# Patient Record
Sex: Female | Born: 1965 | Race: White | Hispanic: No | Marital: Married | Smoking: Never smoker
Health system: Southern US, Community
[De-identification: ages and names within clinical notes are randomized; demographics above are authoritative.]

## PROBLEM LIST (undated history)

## (undated) DIAGNOSIS — F32A Depression, unspecified: Secondary | ICD-10-CM

## (undated) DIAGNOSIS — R519 Headache, unspecified: Secondary | ICD-10-CM

## (undated) DIAGNOSIS — F419 Anxiety disorder, unspecified: Secondary | ICD-10-CM

## (undated) DIAGNOSIS — D649 Anemia, unspecified: Secondary | ICD-10-CM

## (undated) DIAGNOSIS — J302 Other seasonal allergic rhinitis: Secondary | ICD-10-CM

## (undated) DIAGNOSIS — F329 Major depressive disorder, single episode, unspecified: Secondary | ICD-10-CM

## (undated) DIAGNOSIS — R51 Headache: Secondary | ICD-10-CM

## (undated) DIAGNOSIS — R011 Cardiac murmur, unspecified: Secondary | ICD-10-CM

## (undated) HISTORY — PX: TUBAL LIGATION: SHX77

## (undated) HISTORY — PX: WISDOM TOOTH EXTRACTION: SHX21

## (undated) HISTORY — PX: OTHER SURGICAL HISTORY: SHX169

---

## 2008-06-18 ENCOUNTER — Emergency Department (HOSPITAL_COMMUNITY): Admission: EM | Admit: 2008-06-18 | Discharge: 2008-06-18 | Payer: Self-pay | Admitting: Emergency Medicine

## 2017-01-10 ENCOUNTER — Other Ambulatory Visit: Payer: Self-pay | Admitting: Obstetrics and Gynecology

## 2017-01-10 ENCOUNTER — Other Ambulatory Visit (HOSPITAL_COMMUNITY)
Admission: RE | Admit: 2017-01-10 | Discharge: 2017-01-10 | Disposition: A | Discharge status: Home / Self Care | Payer: 59 | Source: Ambulatory Visit | Attending: Obstetrics and Gynecology | Admitting: Obstetrics and Gynecology

## 2017-01-10 DIAGNOSIS — Z01419 Encounter for gynecological examination (general) (routine) without abnormal findings: Secondary | ICD-10-CM | POA: Insufficient documentation

## 2017-01-10 DIAGNOSIS — Z1151 Encounter for screening for human papillomavirus (HPV): Secondary | ICD-10-CM | POA: Insufficient documentation

## 2017-01-12 ENCOUNTER — Other Ambulatory Visit: Payer: Self-pay | Admitting: Obstetrics and Gynecology

## 2017-01-12 DIAGNOSIS — Z1231 Encounter for screening mammogram for malignant neoplasm of breast: Secondary | ICD-10-CM

## 2017-01-12 LAB — CYTOLOGY - PAP
Diagnosis: NEGATIVE
HPV: NOT DETECTED

## 2017-01-16 ENCOUNTER — Encounter: Payer: Self-pay | Admitting: Radiology

## 2017-01-16 ENCOUNTER — Ambulatory Visit
Admission: RE | Admit: 2017-01-16 | Discharge: 2017-01-16 | Disposition: A | Discharge status: Home / Self Care | Payer: 59 | Source: Ambulatory Visit | Attending: Obstetrics and Gynecology | Admitting: Obstetrics and Gynecology

## 2017-01-16 DIAGNOSIS — Z1231 Encounter for screening mammogram for malignant neoplasm of breast: Secondary | ICD-10-CM

## 2018-01-11 ENCOUNTER — Other Ambulatory Visit: Payer: Self-pay | Admitting: Obstetrics and Gynecology

## 2018-01-11 DIAGNOSIS — Z1231 Encounter for screening mammogram for malignant neoplasm of breast: Secondary | ICD-10-CM

## 2018-01-18 ENCOUNTER — Ambulatory Visit
Admission: RE | Admit: 2018-01-18 | Discharge: 2018-01-18 | Disposition: A | Discharge status: Home / Self Care | Payer: 59 | Source: Ambulatory Visit | Attending: Obstetrics and Gynecology | Admitting: Obstetrics and Gynecology

## 2018-01-18 DIAGNOSIS — Z1231 Encounter for screening mammogram for malignant neoplasm of breast: Secondary | ICD-10-CM

## 2018-01-30 ENCOUNTER — Other Ambulatory Visit: Payer: Self-pay | Admitting: Obstetrics and Gynecology

## 2018-02-27 ENCOUNTER — Encounter (HOSPITAL_COMMUNITY)
Admission: RE | Admit: 2018-02-27 | Discharge: 2018-02-27 | Disposition: A | Discharge status: Home / Self Care | Payer: 59 | Source: Ambulatory Visit | Attending: Obstetrics and Gynecology | Admitting: Obstetrics and Gynecology

## 2018-02-27 ENCOUNTER — Encounter (HOSPITAL_COMMUNITY): Payer: Self-pay

## 2018-02-27 ENCOUNTER — Other Ambulatory Visit: Payer: Self-pay

## 2018-02-27 DIAGNOSIS — Z01812 Encounter for preprocedural laboratory examination: Secondary | ICD-10-CM | POA: Diagnosis not present

## 2018-02-27 HISTORY — DX: Depression, unspecified: F32.A

## 2018-02-27 HISTORY — DX: Anemia, unspecified: D64.9

## 2018-02-27 HISTORY — DX: Other seasonal allergic rhinitis: J30.2

## 2018-02-27 HISTORY — DX: Cardiac murmur, unspecified: R01.1

## 2018-02-27 HISTORY — DX: Headache, unspecified: R51.9

## 2018-02-27 HISTORY — DX: Headache: R51

## 2018-02-27 HISTORY — DX: Major depressive disorder, single episode, unspecified: F32.9

## 2018-02-27 HISTORY — DX: Anxiety disorder, unspecified: F41.9

## 2018-02-27 LAB — CBC
HCT: 35.5 % — ABNORMAL LOW (ref 36.0–46.0)
Hemoglobin: 12 g/dL (ref 12.0–15.0)
MCH: 29.9 pg (ref 26.0–34.0)
MCHC: 33.8 g/dL (ref 30.0–36.0)
MCV: 88.5 fL (ref 78.0–100.0)
PLATELETS: 301 10*3/uL (ref 150–400)
RBC: 4.01 MIL/uL (ref 3.87–5.11)
RDW: 15.2 % (ref 11.5–15.5)
WBC: 7.6 10*3/uL (ref 4.0–10.5)

## 2018-02-27 NOTE — Patient Instructions (Addendum)
Your procedure is scheduled on: Wednesday Mar 13, 2018 at 2:30 pm  Enter through the Main Entrance of [facility] at: 1:00 pm  Pick up the phone at the desk and dial [phone].  Call this number if you have problems the morning of surgery: [phone].  Remember: Do NOT eat food: after Midnight on Tuesday May 21 Do NOT drink clear liquids after: 8 am Wednesday, day of surgery Take these medicines the morning of surgery with a SIP OF WATER:  None  STOP ALL VITAMINS AND SUPPLEMENTS, IBUPROFEN WEDNESDAY, 03/06/18.  Do NOT wear jewelry (body piercing), metal hair clips/bobby pins, make-up, or nail polish. Do NOT wear lotions, powders, or perfumes.  You may wear deoderant. Do NOT shave for 48 hours prior to surgery. Do NOT bring valuables to the hospital.  Have a responsible adult drive you home and stay with you for 24 hours after your procedure.  Home with Husband Clair Gulling cell [phone].

## 2018-03-12 NOTE — H&P (Signed)
  History of Present Illness  General:  Pt presents for preop for hysteroscopy, D&C, Myomectomy via Myosure and endometrial ablation. Pt is not currently bleeding. Lysteda has helped in the past.   Current Medications  Taking   Vitamin D 3 50,000 units pill 1 pill orally once a week   Magnesium 250 MG Tablet 1 tablet with a meal Orally Once a day   Ibuprofen 200 MG Tablet 1 tablet with food or milk as needed Orally Three times a day, Notes: prn   Integra 62.5-62.5-40-3 MG Capsule 1 capsule Orally Once a day   Calcium 500-125 MG-UNIT Tablet as directed Orally Once a day   Not-Taking   Lysteda(Tranexamic Acid) 650 MG Tablet 2 tablets Orally TID up to 5 days with menses   Medication List reviewed and reconciled with the patient    Past Medical History  Medical History Verified..           Surgical History  c-section 1988  c-section 1990  knee surgery 2006   Family History  Father: alive, pt does not have contact with father  Mother: alive, fibroids  denies any GYN family cancer hx.   Social History  General:  Tobacco use  cigarettes: Never smoked Tobacco history last updated 02/27/2018 Alcohol: Rare.  no Recreational drug use.  no Exercise.  Marital Status: married.  Children: Boys, 3.    Gyn History  Sexual activity currently sexually active.  Periods : every month, heavy bleeding, pain.  LMP 01/28/2018.  Birth control BTL.  Last pap smear date 01/10/2017 Neg/HPVneg.  Last mammogram date 01/18/2018.  Denies H/O STD.    OB History  Number of pregnancies 3.  Pregnancy # 1 live birth, C-section delivery.  Pregnancy # 2 live birth, C-section.  Pregnancy # 3 live birth, vaginal delivery.    Allergies  Codeine (for allergy): hallucinations   Hospitalization/Major Diagnostic Procedure  [place] measles 1990  None 12/2017   Review of Systems  Denies fever/chills, chest pain, SOB, headaches, numbness/tingling. No h/o complication with anesthesia, bleeding  disorders or blood clots.   Vital Signs  Wt 181, Wt change -2.3 lb, Ht 65.5, BMI 29.66, Pulse sitting 80, BP sitting 94/62.   Physical Examination  GENERAL:  Patient appears alert and oriented.  General Appearance: well-appearing, well-developed, no acute distress.  Speech: clear.  LUNGS:  Auscultation: no wheezing/rhonchi/rales. CTA bilaterally.  HEART:  Heart sounds: normal. RRR. no murmur.  ABDOMEN:  General: soft nontender, nondistended, no masses.  FEMALE GENITOURINARY:  Pelvic retroverted uterus, no discharge.  EXTREMITIES:  General: No edema or calf tenderness.     Assessments   1. Pre-operative clearance - Z01.818 (Primary)   2. Menorrhagia with regular cycle - N92.0   3. Fibroids, submucosal - D25.0   Treatment  1. Pre-operative clearance  Notes: Pt counseled on R/B/A of procedures. All questions answered. Ablation to lighten menses or stop menses.    Visit Codes  99213 OV LEVEL 3.    Follow Up  2 Weeks post op

## 2018-03-13 ENCOUNTER — Ambulatory Visit (HOSPITAL_COMMUNITY): Payer: 59 | Admitting: Anesthesiology

## 2018-03-13 ENCOUNTER — Ambulatory Visit (HOSPITAL_COMMUNITY)
Admission: RE | Admit: 2018-03-13 | Discharge: 2018-03-13 | Disposition: A | Discharge status: Home / Self Care | Payer: 59 | Source: Ambulatory Visit | Attending: Obstetrics and Gynecology | Admitting: Obstetrics and Gynecology

## 2018-03-13 ENCOUNTER — Ambulatory Visit (HOSPITAL_COMMUNITY): Payer: 59

## 2018-03-13 ENCOUNTER — Other Ambulatory Visit: Payer: Self-pay

## 2018-03-13 ENCOUNTER — Encounter (HOSPITAL_COMMUNITY)
Admission: RE | Disposition: A | Discharge status: Home / Self Care | Payer: Self-pay | Source: Ambulatory Visit | Attending: Obstetrics and Gynecology

## 2018-03-13 ENCOUNTER — Encounter (HOSPITAL_COMMUNITY): Payer: Self-pay

## 2018-03-13 DIAGNOSIS — D259 Leiomyoma of uterus, unspecified: Secondary | ICD-10-CM | POA: Insufficient documentation

## 2018-03-13 DIAGNOSIS — Z885 Allergy status to narcotic agent status: Secondary | ICD-10-CM | POA: Diagnosis not present

## 2018-03-13 DIAGNOSIS — N920 Excessive and frequent menstruation with regular cycle: Secondary | ICD-10-CM | POA: Insufficient documentation

## 2018-03-13 DIAGNOSIS — Z419 Encounter for procedure for purposes other than remedying health state, unspecified: Secondary | ICD-10-CM

## 2018-03-13 DIAGNOSIS — D649 Anemia, unspecified: Secondary | ICD-10-CM | POA: Insufficient documentation

## 2018-03-13 HISTORY — PX: HYSTEROSCOPY: SHX211

## 2018-03-13 HISTORY — PX: DILATATION & CURETTAGE/HYSTEROSCOPY WITH MYOSURE: SHX6511

## 2018-03-13 LAB — PREGNANCY, URINE: Preg Test, Ur: NEGATIVE

## 2018-03-13 SURGERY — DILATATION & CURETTAGE/HYSTEROSCOPY WITH MYOSURE
Anesthesia: General

## 2018-03-13 MED ORDER — ONDANSETRON HCL 4 MG/2ML IJ SOLN
INTRAMUSCULAR | Status: DC | PRN
  Administered 2018-03-13: 4 mg via INTRAVENOUS

## 2018-03-13 MED ORDER — SCOPOLAMINE 1 MG/3DAYS TD PT72
1.0000 | MEDICATED_PATCH | Freq: Once | TRANSDERMAL | Status: DC
  Administered 2018-03-13: 1.5 mg via TRANSDERMAL

## 2018-03-13 MED ORDER — PROPOFOL 10 MG/ML IV BOLUS
INTRAVENOUS | Status: DC | PRN
  Administered 2018-03-13: 180 mg via INTRAVENOUS
  Administered 2018-03-13: 20 mg via INTRAVENOUS

## 2018-03-13 MED ORDER — KETOROLAC TROMETHAMINE 30 MG/ML IJ SOLN
INTRAMUSCULAR | Status: DC | PRN
  Administered 2018-03-13: 30 mg via INTRAVENOUS

## 2018-03-13 MED ORDER — DEXAMETHASONE SODIUM PHOSPHATE 10 MG/ML IJ SOLN
INTRAMUSCULAR | Status: DC | PRN
  Administered 2018-03-13: 10 mg via INTRAVENOUS

## 2018-03-13 MED ORDER — HYDROMORPHONE HCL 1 MG/ML IJ SOLN
0.2500 mg | INTRAMUSCULAR | Status: DC | PRN
  Administered 2018-03-13: 0.25 mg via INTRAVENOUS
  Administered 2018-03-13: 0.5 mg via INTRAVENOUS

## 2018-03-13 MED ORDER — HYDROMORPHONE HCL 1 MG/ML IJ SOLN
INTRAMUSCULAR | Status: AC
  Filled 2018-03-13: qty 0.5

## 2018-03-13 MED ORDER — PROPOFOL 10 MG/ML IV BOLUS
INTRAVENOUS | Status: AC
  Filled 2018-03-13: qty 20

## 2018-03-13 MED ORDER — LIDOCAINE HCL 2 % IJ SOLN
INTRAMUSCULAR | Status: AC
  Filled 2018-03-13: qty 20

## 2018-03-13 MED ORDER — MIDAZOLAM HCL 2 MG/2ML IJ SOLN
INTRAMUSCULAR | Status: AC
  Filled 2018-03-13: qty 2

## 2018-03-13 MED ORDER — KETOROLAC TROMETHAMINE 30 MG/ML IJ SOLN
INTRAMUSCULAR | Status: AC
  Filled 2018-03-13: qty 1

## 2018-03-13 MED ORDER — HYDROMORPHONE HCL 1 MG/ML IJ SOLN
INTRAMUSCULAR | Status: AC
  Administered 2018-03-13: 0.5 mg via INTRAVENOUS
  Filled 2018-03-13: qty 0.5

## 2018-03-13 MED ORDER — SCOPOLAMINE 1 MG/3DAYS TD PT72
MEDICATED_PATCH | TRANSDERMAL | Status: AC
  Administered 2018-03-13: 1.5 mg via TRANSDERMAL
  Filled 2018-03-13: qty 1

## 2018-03-13 MED ORDER — SODIUM CHLORIDE 0.9 % IV SOLN
INTRAVENOUS | Status: DC | PRN
  Administered 2018-03-13: 19 mL via INTRAMUSCULAR

## 2018-03-13 MED ORDER — LIDOCAINE HCL (CARDIAC) PF 100 MG/5ML IV SOSY
PREFILLED_SYRINGE | INTRAVENOUS | Status: DC | PRN
  Administered 2018-03-13: 50 mg via INTRAVENOUS

## 2018-03-13 MED ORDER — ONDANSETRON HCL 4 MG/2ML IJ SOLN
INTRAMUSCULAR | Status: AC
  Filled 2018-03-13: qty 2

## 2018-03-13 MED ORDER — DEXAMETHASONE SODIUM PHOSPHATE 10 MG/ML IJ SOLN
INTRAMUSCULAR | Status: AC
  Filled 2018-03-13: qty 1

## 2018-03-13 MED ORDER — PROMETHAZINE HCL 25 MG/ML IJ SOLN: 6.2500 mg | INTRAMUSCULAR | Status: DC | PRN

## 2018-03-13 MED ORDER — LIDOCAINE HCL 2 % IJ SOLN
INTRAMUSCULAR | Status: DC | PRN
  Administered 2018-03-13: 10 mL

## 2018-03-13 MED ORDER — FENTANYL CITRATE (PF) 100 MCG/2ML IJ SOLN
INTRAMUSCULAR | Status: AC
  Filled 2018-03-13: qty 2

## 2018-03-13 MED ORDER — VASOPRESSIN 20 UNIT/ML IV SOLN
INTRAVENOUS | Status: AC
  Filled 2018-03-13: qty 1

## 2018-03-13 MED ORDER — SODIUM CHLORIDE 0.9 % IR SOLN
Status: DC | PRN
  Administered 2018-03-13: 6000 mL

## 2018-03-13 MED ORDER — SODIUM CHLORIDE 0.9 % IJ SOLN
INTRAMUSCULAR | Status: AC
Start: 2018-03-13 — End: ?
  Filled 2018-03-13: qty 50

## 2018-03-13 MED ORDER — SODIUM CHLORIDE 0.9 % IJ SOLN
INTRAMUSCULAR | Status: DC | PRN
  Administered 2018-03-13: 50 mL via INTRAVENOUS

## 2018-03-13 MED ORDER — MIDAZOLAM HCL 2 MG/2ML IJ SOLN
INTRAMUSCULAR | Status: DC | PRN
  Administered 2018-03-13: 2 mg via INTRAVENOUS

## 2018-03-13 MED ORDER — LACTATED RINGERS IV SOLN
INTRAVENOUS | Status: DC
  Administered 2018-03-13: 125 mL/h via INTRAVENOUS

## 2018-03-13 MED ORDER — FENTANYL CITRATE (PF) 100 MCG/2ML IJ SOLN
INTRAMUSCULAR | Status: DC | PRN
  Administered 2018-03-13: 50 ug via INTRAVENOUS
  Administered 2018-03-13: 25 ug via INTRAVENOUS
  Administered 2018-03-13: 100 ug via INTRAVENOUS
  Administered 2018-03-13: 25 ug via INTRAVENOUS

## 2018-03-13 MED ORDER — GLYCOPYRROLATE 0.2 MG/ML IJ SOLN
INTRAMUSCULAR | Status: AC
  Filled 2018-03-13: qty 1

## 2018-03-13 MED ORDER — LIDOCAINE HCL (CARDIAC) PF 100 MG/5ML IV SOSY
PREFILLED_SYRINGE | INTRAVENOUS | Status: AC
  Filled 2018-03-13: qty 5

## 2018-03-13 SURGICAL SUPPLY — 20 items
CANISTER SUCT 3000ML PPV (MISCELLANEOUS) ×3 IMPLANT
CATH ROBINSON RED A/P 16FR (CATHETERS) ×3 IMPLANT
DEVICE MYOSURE LITE (MISCELLANEOUS) IMPLANT
DEVICE MYOSURE REACH (MISCELLANEOUS) IMPLANT
DILATOR CANAL MILEX (MISCELLANEOUS) ×3 IMPLANT
FILTER ARTHROSCOPY CONVERTOR (FILTER) ×3 IMPLANT
GAUZE SPONGE 4X4 16PLY XRAY LF (GAUZE/BANDAGES/DRESSINGS) ×2 IMPLANT
GLOVE BIO SURGEON STRL SZ7 (GLOVE) ×3 IMPLANT
GLOVE BIOGEL PI IND STRL 7.0 (GLOVE) ×2 IMPLANT
GLOVE BIOGEL PI INDICATOR 7.0 (GLOVE) ×4
GOWN STRL REUS W/TWL LRG LVL3 (GOWN DISPOSABLE) ×6 IMPLANT
MYOSURE XL FIBROID REM (MISCELLANEOUS) ×3
PACK VAGINAL MINOR WOMEN LF (CUSTOM PROCEDURE TRAY) ×3 IMPLANT
PAD OB MATERNITY 4.3X12.25 (PERSONAL CARE ITEMS) ×6 IMPLANT
SEAL ROD LENS SCOPE MYOSURE (ABLATOR) ×3 IMPLANT
SET GENESYS HTA PROCERVA (MISCELLANEOUS) ×3 IMPLANT
SYSTEM TISS REMOVAL MYSR XL RM (MISCELLANEOUS) IMPLANT
TOWEL OR 17X24 6PK STRL BLUE (TOWEL DISPOSABLE) ×6 IMPLANT
TUBING AQUILEX INFLOW (TUBING) ×5 IMPLANT
TUBING AQUILEX OUTFLOW (TUBING) ×3 IMPLANT

## 2018-03-13 NOTE — Anesthesia Postprocedure Evaluation (Signed)
Anesthesia Post Note  Patient: Meredith Moreno  Procedure(s) Performed: DILATATION & CURETTAGE/HYSTEROSCOPY WITH MYOSURE ULTRASOUND ASSISTED (N/A ) HYSTEROSCOPY WITH HYDROTHERMAL ABLATION (N/A )     Patient location during evaluation: PACU Anesthesia Type: General Level of consciousness: awake and alert Pain management: pain level controlled Vital Signs Assessment: post-procedure vital signs reviewed and stable Respiratory status: spontaneous breathing, nonlabored ventilation, respiratory function stable and patient connected to nasal cannula oxygen Cardiovascular status: blood pressure returned to baseline and stable Postop Assessment: no apparent nausea or vomiting Anesthetic complications: no    Last Vitals:  Vitals:   03/13/18 1615 03/13/18 1630  BP: 113/79 118/79  Pulse: (!) 59 66  Resp: 12 15  Temp: 36.7 C   SpO2: 100% 100%    Last Pain:  Vitals:   03/13/18 1630  PainSc: 3    Pain Goal: Patients Stated Pain Goal: 3 (03/13/18 1523)               Tiajuana Amass

## 2018-03-13 NOTE — Anesthesia Procedure Notes (Signed)
Procedure Name: LMA Insertion Date/Time: 03/13/2018 1:26 PM Performed by: Asher Muir, CRNA Pre-anesthesia Checklist: Patient being monitored, Patient identified, Emergency Drugs available and Suction available Patient Re-evaluated:Patient Re-evaluated prior to induction Oxygen Delivery Method: Circle system utilized Preoxygenation: Pre-oxygenation with 100% oxygen Induction Type: IV induction and Inhalational induction Ventilation: Mask ventilation without difficulty LMA: LMA inserted LMA Size: 4.0 Number of attempts: 1 Dental Injury: Teeth and Oropharynx as per pre-operative assessment

## 2018-03-13 NOTE — Transfer of Care (Signed)
Immediate Anesthesia Transfer of Care Note  Patient: Meredith Moreno  Procedure(s) Performed: DILATATION & CURETTAGE/HYSTEROSCOPY WITH MYOSURE ULTRASOUND ASSISTED (N/A ) HYSTEROSCOPY WITH HYDROTHERMAL ABLATION (N/A )  Patient Location: PACU  Anesthesia Type:General  Level of Consciousness: sedated  Airway & Oxygen Therapy: Patient Spontanous Breathing and Patient connected to nasal cannula oxygen  Post-op Assessment: Report given to RN  Post vital signs: Reviewed and stable  Last Vitals:  Vitals Value Taken Time  BP    Temp    Pulse    Resp    SpO2      Last Pain:  Vitals:   03/13/18 1200  PainSc: 2       Patients Stated Pain Goal: 3 (70/14/10 3013)  Complications: No apparent anesthesia complications

## 2018-03-13 NOTE — Discharge Instructions (Addendum)
Dilation and Curettage or Vacuum Curettage, Care After °This sheet gives you information about how to care for yourself after your procedure. Your health care provider may also give you more specific instructions. If you have problems or questions, contact your health care provider. °What can I expect after the procedure? °After your procedure, it is common to have: °· Mild pain or cramping. °· Some vaginal bleeding or spotting. ° °These may last for up to 2 weeks after your procedure. °Follow these instructions at home: °Activity ° °· Do not drive or use heavy machinery while taking prescription pain medicine. °· Avoid driving for the first 24 hours after your procedure. °· Take frequent, short walks, followed by rest periods, throughout the day. Ask your health care provider what activities are safe for you. After 1-2 days, you may be able to return to your normal activities. °· Do not lift anything heavier than 10 lb (4.5 kg) until your health care provider approves. °· For at least 2 weeks, or as long as told by your health care provider, do not: °? Douche. °? Use tampons. °? Have sexual intercourse. °General instructions ° °· Take over-the-counter and prescription medicines only as told by your health care provider. This is especially important if you take blood thinning medicine. °· Do not take baths, swim, or use a hot tub until your health care provider approves. Take showers instead of baths. °· Wear compression stockings as told by your health care provider. These stockings help to prevent blood clots and reduce swelling in your legs. °· It is your responsibility to get the results of your procedure. Ask your health care provider, or the department performing the procedure, when your results will be ready. °· Keep all follow-up visits as told by your health care provider. This is important. °Contact a health care provider if: °· You have severe cramps that get worse or that do not get better with  medicine. °· You have severe abdominal pain. °· You cannot drink fluids without vomiting. °· You develop pain in a different area of your pelvis. °· You have bad-smelling vaginal discharge. °· You have a rash. °Get help right away if: °· You have vaginal bleeding that soaks more than one sanitary pad in 1 hour, for 2 hours in a row. °· You pass large blood clots from your vagina. °· You have a fever that is above 100.4°F (38.0°C). °· Your abdomen feels very tender or hard. °· You have chest pain. °· You have shortness of breath. °· You cough up blood. °· You feel dizzy or light-headed. °· You faint. °· You have pain in your neck or shoulder area. °This information is not intended to replace advice given to you by your health care provider. Make sure you discuss any questions you have with your health care provider. °Document Released: 10/06/2000 Document Revised: 06/07/2016 Document Reviewed: 05/11/2016 °Elsevier Interactive Patient Education © 2018 [employer] Inc[employer] ° ° °Endometrial Ablation °Endometrial ablation is a procedure that destroys the thin inner layer of the lining of the uterus (endometrium). This procedure may be done: °· To stop heavy periods. °· To stop bleeding that is causing anemia. °· To control irregular bleeding. °· To treat bleeding caused by small tumors (fibroids) in the endometrium. ° °This procedure is often an alternative to major surgery, such as removal of the uterus and cervix (hysterectomy). As a result of this procedure: °· You may not be able to have children. However, if you are premenopausal (you have   not gone through menopause): °? You may still have a small chance of getting pregnant. °? You will need to use a reliable method of birth control after the procedure to prevent pregnancy. °· You may stop having a menstrual period, or you may have only a small amount of bleeding during your period. Menstruation may return several years after the procedure. ° °Tell a health care provider  about: °· Any allergies you have. °· All medicines you are taking, including vitamins, herbs, eye drops, creams, and over-the-counter medicines. °· Any problems you or family members have had with the use of anesthetic medicines. °· Any blood disorders you have. °· Any surgeries you have had. °· Any medical conditions you have. °What are the risks? °Generally, this is a safe procedure. However, problems may occur, including: °· A hole (perforation) in the uterus or bowel. °· Infection of the uterus, bladder, or vagina. °· Bleeding. °· Damage to other structures or organs. °· An air bubble in the lung (air embolus). °· Problems with pregnancy after the procedure. °· Failure of the procedure. °· Decreased ability to diagnose cancer in the endometrium. ° °What happens before the procedure? °· You will have tests of your endometrium to make sure there are no pre-cancerous cells or cancer cells present. °· You may have an ultrasound of the uterus. °· You may be given medicines to thin the endometrium. °· Ask your health care provider about: °? Changing or stopping your regular medicines. This is especially important if you take diabetes medicines or blood thinners. °? Taking medicines such as aspirin and ibuprofen. These medicines can thin your blood. Do not take these medicines before your procedure if your doctor tells you not to. °· Plan to have someone take you home from the hospital or clinic. °What happens during the procedure? °· You will lie on an exam table with your feet and legs supported as in a pelvic exam. °· To lower your risk of infection: °? Your health care team will wash or sanitize their hands and put on germ-free (sterile) gloves. °? Your genital area will be washed with soap. °· An IV tube will be inserted into one of your veins. °· You will be given a medicine to help you relax (sedative). °· A surgical instrument with a light and camera (resectoscope) will be inserted into your vagina and moved  into your uterus. This allows your surgeon to see inside your uterus. °· Endometrial tissue will be removed using one of the following methods: °? Radiofrequency. This method uses a radiofrequency-alternating electric current to remove the endometrium. °? Cryotherapy. This method uses extreme cold to freeze the endometrium. °? Heated-free liquid. This method uses a heated saltwater (saline) solution to remove the endometrium. °? Microwave. This method uses high-energy microwaves to heat up the endometrium and remove it. °? Thermal balloon. This method involves inserting a catheter with a balloon tip into the uterus. The balloon tip is filled with heated fluid to remove the endometrium. °The procedure may vary among health care providers and hospitals. °What happens after the procedure? °· Your blood pressure, heart rate, breathing rate, and blood oxygen level will be monitored until the medicines you were given have worn off. °· As tissue healing occurs, you may notice vaginal bleeding for 4-6 weeks after the procedure. You may also experience: °? Cramps. °? Thin, watery vaginal discharge that is light pink or brown in color. °? A need to urinate more frequently than usual. °? Nausea. °· Do   not drive for 24 hours if you were given a sedative. °· Do not have sex or insert anything into your vagina until your health care provider approves. °Summary °· Endometrial ablation is done to treat the many causes of heavy menstrual bleeding. °· The procedure may be done only after medications have been tried to control the bleeding. °· Plan to have someone take you home from the hospital or clinic. °This information is not intended to replace advice given to you by your health care provider. Make sure you discuss any questions you have with your health care provider. °Document Released: 08/18/2004 Document Revised: 10/26/2016 Document Reviewed: 10/26/2016 °Elsevier Interactive Patient Education © 2017 Elsevier  Inc. ° ° ° ° °Post Anesthesia Home Care Instructions ° °Activity: °Get plenty of rest for the remainder of the day. A responsible individual must stay with you for 24 hours following the procedure.  °For the next 24 hours, DO NOT: °-Drive a car °-Operate machinery °-Drink alcoholic beverages °-Take any medication unless instructed by your physician °-Make any legal decisions or sign important papers. ° °Meals: °Start with liquid foods such as gelatin or soup. Progress to regular foods as tolerated. Avoid greasy, spicy, heavy foods. If nausea and/or vomiting occur, drink only clear liquids until the nausea and/or vomiting subsides. Call your physician if vomiting continues. ° °Special Instructions/Symptoms: °Your throat may feel dry or sore from the anesthesia or the breathing tube placed in your throat during surgery. If this causes discomfort, gargle with warm salt water. The discomfort should disappear within 24 hours. ° °If you had a scopolamine patch placed behind your ear for the management of post- operative nausea and/or vomiting: ° °1. The medication in the patch is effective for 72 hours, after which it should be removed.  Wrap patch in a tissue and discard in the trash. Wash hands thoroughly with soap and water. °2. You may remove the patch earlier than 72 hours if you experience unpleasant side effects which may include dry mouth, dizziness or visual disturbances. °3. Avoid touching the patch. Wash your hands with soap and water after contact with the patch. °  ° ° °

## 2018-03-13 NOTE — Op Note (Signed)
NAME: Meredith Moreno, Meredith Moreno MEDICAL RECORD FU:[id number] ACCOUNT [id number] DATE OF BIRTH:1966-10-15 FACILITY: [facility] LOCATION: [facility] PHYSICIAN:Sebastiana Wuest Al Decant, MD  OPERATIVE REPORT  DATE OF PROCEDURE:  03/13/2018  PREOPERATIVE DIAGNOSES:  Menorrhagia and fibroids.  POSTOPERATIVE DIAGNOSES:  Menorrhagia and fibroids.  PROCEDURES PERFORMED:  Hysteroscopy, dilatation and curettage with hysteroscopic myomectomy with MyoSure and endometrial ablation with HTA.  SURGEON:  Thurnell Lose, MD  ASSISTANT:  Technician.  ANESTHESIA:  Local paracervical block and general.  ESTIMATED BLOOD LOSS:  50.  DRAINS:  I and O, catheterization of the bladder.   LOCAL:   2% lidocaine and vasopressin 20 and 50.    SPECIMEN:  The source is fibroid and endometrial curettings.  DISPOSITION OF SPECIMENS:  To pathology.  PATIENT DISPOSITION:  To the PACU hemodynamically stable.  FINDINGS:  Large endometrial fibroid on the patient's right side in the lower uterine segment sharply retroverted uterus.  Normal appearing endometrium.  DESCRIPTION OF PROCEDURE:  The patient was identified in the holding area.  She was then taken to the operating room with IV running.  She was placed in the dorsal lithotomy position, underwent general endotracheal anesthesia and prepped and draped in a  normal sterile fashion.  A timeout was performed.  SCDs were on her legs and operating.  No antibiotics were given.  A Graves speculum was inserted into the vagina.  The cervix was visualized.  Anterior lip of the cervix was injected with 2% lidocaine and then a single tooth tenaculum was applied.  Paracervical block was performed for a total of 10 mL of 2% lidocaine.   I would eventually inject vasopressin as well.  The 5 mm scope was then advanced through the cervix and I saw the os, but I was going to the and I could not get to the fundus. I dilated even up to an 8 and still could not get around and I did not want  to  perforate.  I wanted to make sure I did not have a false passage.  Ultrasound was called to the room.  The uterus again was noted to be retroverted.  I could not see the uterus very well, so I used a sterile red rubber and inflated and then inserted  water into the bladder, so that we could see the uterus better.  Once we were able to visualize the uterus, I was able to safely enter the uterus with the hysteroscope.  I got around the fibroid into the fundus and when I pulled back, I was able to see  the entirety of this fibroid.  Because of the size of the fibroid, the MyoSure fibroid blade was used.  That was shaved down to the base and thought I was done.  Removed that hysteroscope and inserted the HTA hysteroscope.  The hysteroscopy was performed.  The cavity was not well  distended.  It previously had to go up to 110 when I was using the regular hysteroscope; however, there was no leak on the HTA.  The device ablated for 10 minutes without any warning signs.  When the HTA hysteroscope was then removed from the cavity.  I  then saw the rest of the larger fibroid come into view.  It had completely shelled out of the wall and was sitting in the lower uterine segment.  It was not completely free, but it was a significant portion was still visible.  Because we thought we were finished with the procedure, the MyoSure device was taken off the field.  We got a second fibroid MyoSure device and then I was able to complete the myomectomy.  I was able to get the entire base, got all of the fibroid.  Our  deficit at this point was 2600 at the end of the case.  A sharp curette sharp curet was then used to go inside of the uterus to do a gentle curettage to get an endometrial sampling.  There was bleeding noted.  The single tooth tenaculum was then removed.  Silver nitrate was used to keep the tenaculum site was  hemostatic.  Bleeding slowed significantly by the end of the procedure.  All instruments were  removed from the vagina.  All sponge counts were correct x3.  The patient tolerated the procedure well.  She went to the recovery room in stable condition.  AN/NUANCE  D:03/13/2018 T:03/13/2018 JOB:[id number]

## 2018-03-13 NOTE — Interval H&P Note (Signed)
History and Physical Interval Note:  03/13/2018 12:47 PM  Meredith Moreno  has presented today for surgery, with the diagnosis of N92.4 Menorrhagia, premenopausal  The various methods of treatment have been discussed with the patient and family. After consideration of risks, benefits and other options for treatment, the patient has consented to  Procedure(s) with comments: [place] (N/A) - removal of fibroids HYSTEROSCOPY WITH HYDROTHERMAL ABLATION (N/A) - HTA as a surgical intervention .  The patient's history has been reviewed, patient examined, no change in status, stable for surgery.  I have reviewed the patient's chart and labs.  Questions were answered to the patient's satisfaction.     Thurnell Lose

## 2018-03-13 NOTE — Anesthesia Preprocedure Evaluation (Signed)
Anesthesia Evaluation  Patient identified by MRN, date of birth, ID band Patient awake    Reviewed: Allergy & Precautions, NPO status , Patient's Chart, lab work & pertinent test results  Airway Mallampati: II  TM Distance: >3 FB Neck ROM: Full    Dental  (+) Dental Advisory Given   Pulmonary neg pulmonary ROS,    breath sounds clear to auscultation       Cardiovascular negative cardio ROS   Rhythm:Regular Rate:Normal     Neuro/Psych negative neurological ROS     GI/Hepatic negative GI ROS, Neg liver ROS,   Endo/Other  negative endocrine ROS  Renal/GU negative Renal ROS     Musculoskeletal   Abdominal   Peds  Hematology  (+) anemia ,   Anesthesia Other Findings   Reproductive/Obstetrics                             Lab Results  Component Value Date   WBC 7.6 02/27/2018   HGB 12.0 02/27/2018   HCT 35.5 (L) 02/27/2018   MCV 88.5 02/27/2018   PLT 301 02/27/2018   No results found for: CREATININE, BUN, NA, K, CL, CO2  Anesthesia Physical Anesthesia Plan  ASA: II  Anesthesia Plan: General   Post-op Pain Management:    Induction: Intravenous  PONV Risk Score and Plan: 4 or greater and Dexamethasone, Ondansetron, Scopolamine patch - Pre-op and Midazolam  Airway Management Planned: LMA  Additional Equipment:   Intra-op Plan:   Post-operative Plan: Extubation in OR  Informed Consent: I have reviewed the patients History and Physical, chart, labs and discussed the procedure including the risks, benefits and alternatives for the proposed anesthesia with the patient or authorized representative who has indicated his/her understanding and acceptance.   Dental advisory given  Plan Discussed with: CRNA  Anesthesia Plan Comments:         Anesthesia Quick Evaluation

## 2018-03-13 NOTE — Brief Op Note (Signed)
03/13/2018  3:28 PM  PATIENT:  Meredith Moreno  52 y.o. female  PRE-OPERATIVE DIAGNOSIS:  N92.4 Menorrhagia, premenopausal, Fibroids  POST-OPERATIVE DIAGNOSIS: Same  PROCEDURE:  Procedure(s) with comments: DILATATION & CURETTAGE/HYSTEROSCOPY WITH MYOSURE ULTRASOUND ASSISTED (N/A) - removal of fibroids HYSTEROSCOPY WITH HYDROTHERMAL ABLATION (N/A) - HTA  Ultrasound guided  SURGEON:  Surgeon(s) and Role:    Thurnell Lose, MD - Primary  PHYSICIAN ASSISTANT:   ASSISTANTS: Technician   ANESTHESIA:   local and paracervical block, General  EBL:  50 mL   BLOOD ADMINISTERED:none  DRAINS: I/O Cath of bladder   LOCAL MEDICATIONS USED:  2% LIDOCAINE  and OTHER Vasopressin 20/50  SPECIMEN:  Source of Specimen:  Fibroid, endometrial currettings  DISPOSITION OF SPECIMEN:  PATHOLOGY  COUNTS:  YES  TOURNIQUET:  * No tourniquets in log *  DICTATION: .Other Dictation: Dictation Number no number given  PLAN OF CARE: Discharge to home after PACU  PATIENT DISPOSITION:  PACU - hemodynamically stable.   Delay start of Pharmacological VTE agent (>24hrs) due to surgical blood loss or risk of bleeding: yes

## 2018-03-14 ENCOUNTER — Encounter (HOSPITAL_COMMUNITY): Payer: Self-pay | Admitting: Obstetrics and Gynecology

## 2018-07-04 ENCOUNTER — Other Ambulatory Visit: Payer: Self-pay | Admitting: Gastroenterology

## 2018-07-04 DIAGNOSIS — R1084 Generalized abdominal pain: Secondary | ICD-10-CM

## 2018-07-04 DIAGNOSIS — R1032 Left lower quadrant pain: Secondary | ICD-10-CM

## 2018-07-11 ENCOUNTER — Ambulatory Visit
Admission: RE | Admit: 2018-07-11 | Discharge: 2018-07-11 | Disposition: A | Discharge status: Home / Self Care | Payer: 59 | Source: Ambulatory Visit | Attending: Gastroenterology | Admitting: Gastroenterology

## 2018-07-11 DIAGNOSIS — R1032 Left lower quadrant pain: Secondary | ICD-10-CM

## 2018-07-11 DIAGNOSIS — R1084 Generalized abdominal pain: Secondary | ICD-10-CM

## 2018-07-11 IMAGING — CT CT ABD-PELV W/ CM
1 of 3 series · 13 of 32 positions shown, 19 images · IV contrast (iopamidol)
Comparison: None.

CLINICAL DATA: Left-sided abdominal pain for several months.

EXAM:
CT ABDOMEN AND PELVIS WITH CONTRAST
TECHNIQUE: Multidetector CT imaging of the abdomen and pelvis was performed
using the standard protocol following bolus administration of
intravenous contrast.
CONTRAST:  100mL [0P] IOPAMIDOL ([0P]) INJECTION 61%

[Series 2: abd/pelvis w/cm · axial · 0.75mm/px · z∈[-441,-21]mm · 13 of 99 slices shown, 19 images]
[im 8/99  soft-tissue]
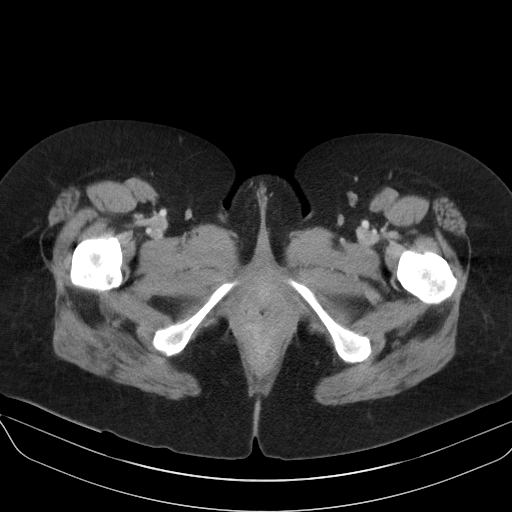
[im 8/99  bone]
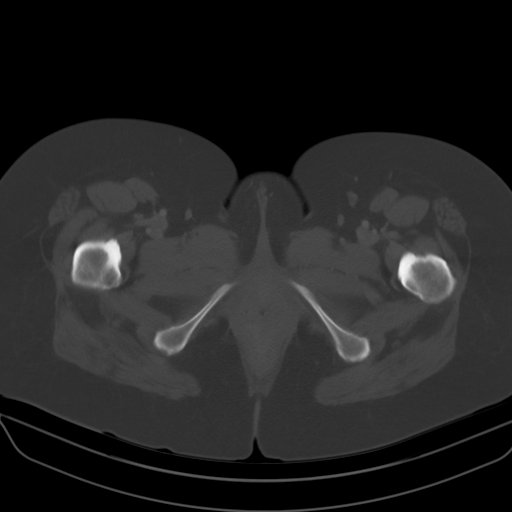
[im 15/99  soft-tissue]
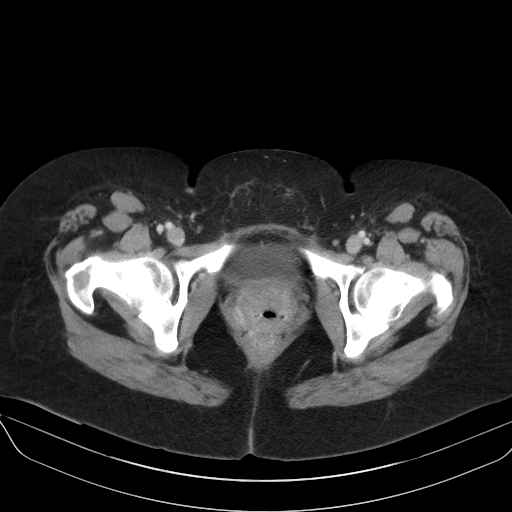
[im 22/99  soft-tissue]
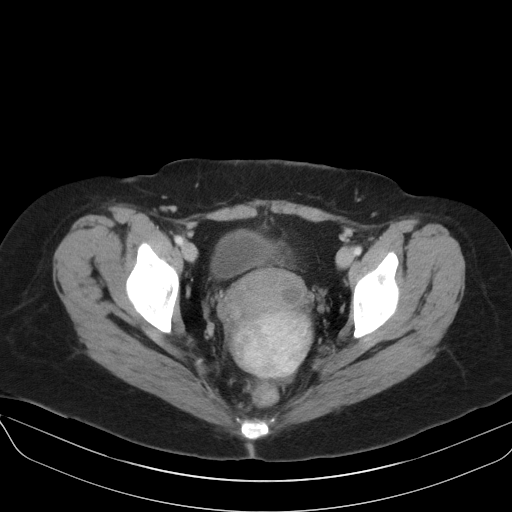
[im 29/99  soft-tissue]
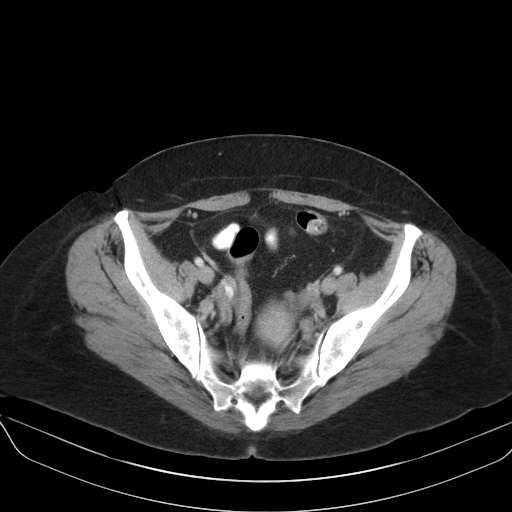
[im 36/99  soft-tissue]
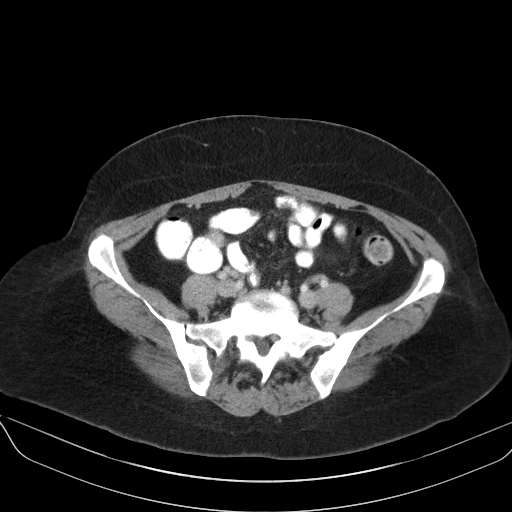
[im 43/99  soft-tissue]
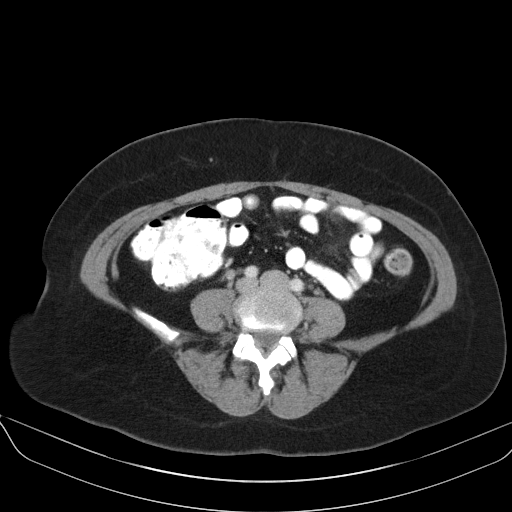
[im 50/99  soft-tissue]
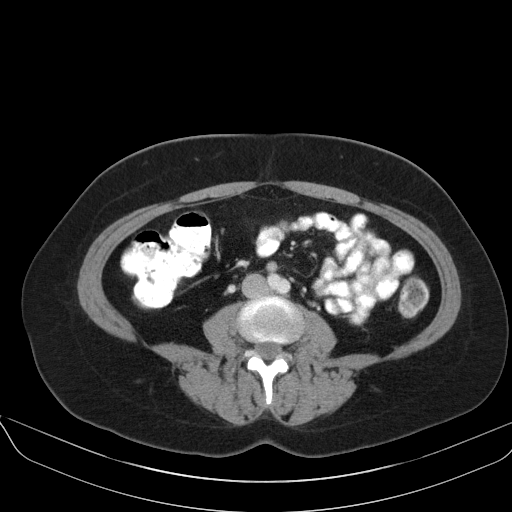
[im 57/99  soft-tissue]
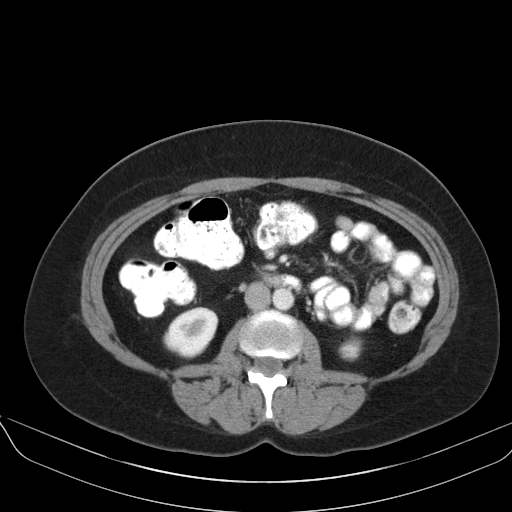
[im 64/99  soft-tissue]
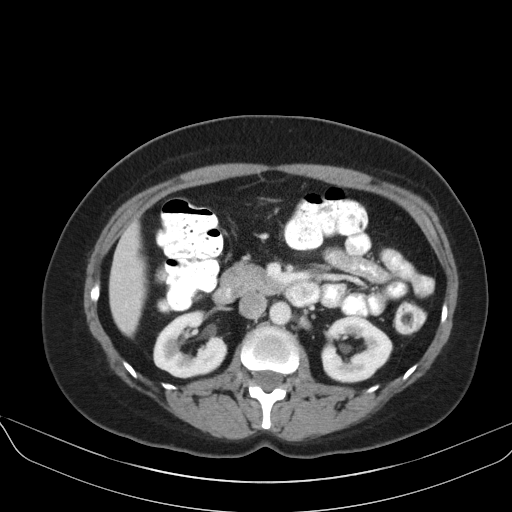
[im 64/99  bone]
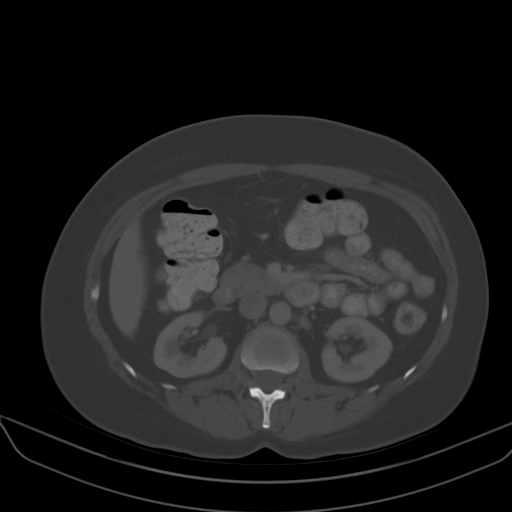
[im 71/99  soft-tissue]
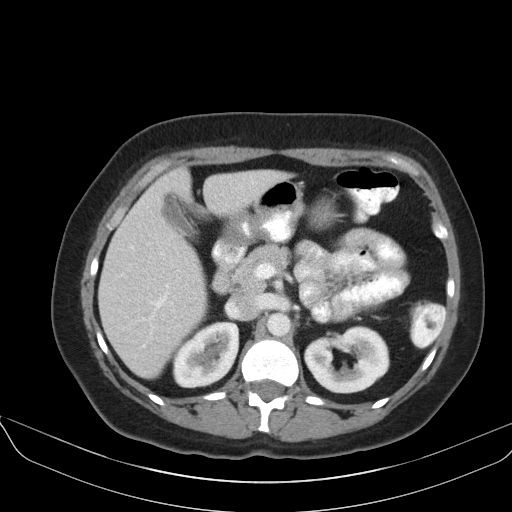
[im 71/99  lung]
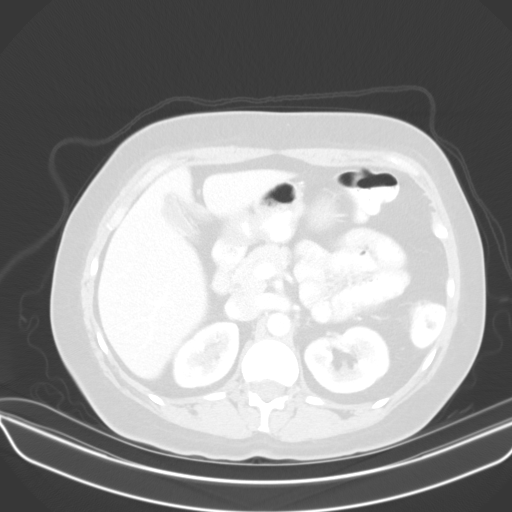
[im 78/99  soft-tissue]
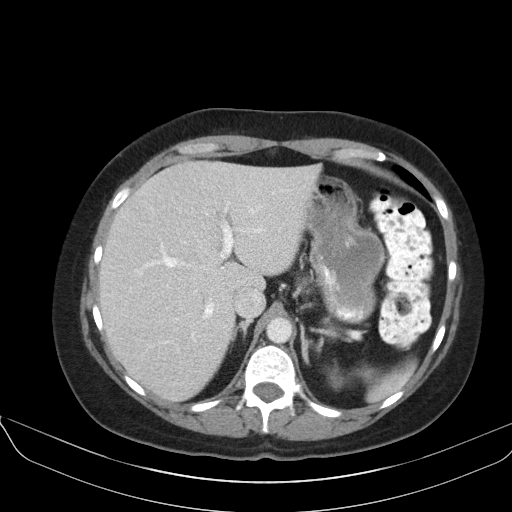
[im 78/99  lung]
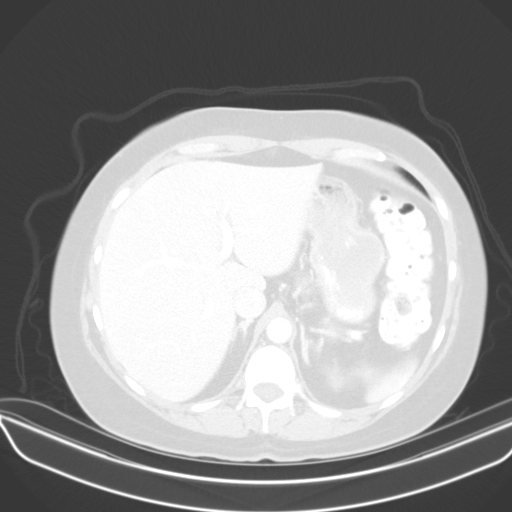
[im 85/99  soft-tissue]
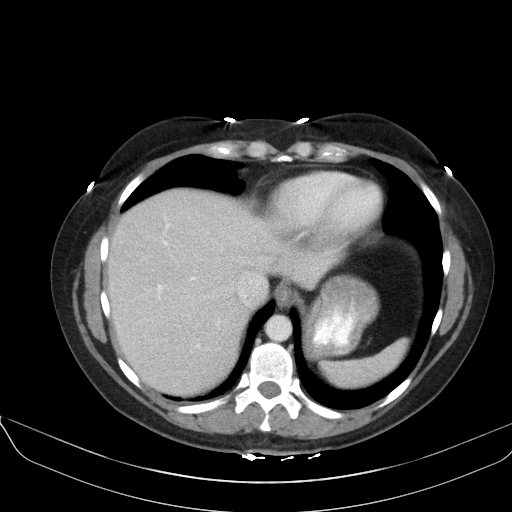
[im 85/99  lung]
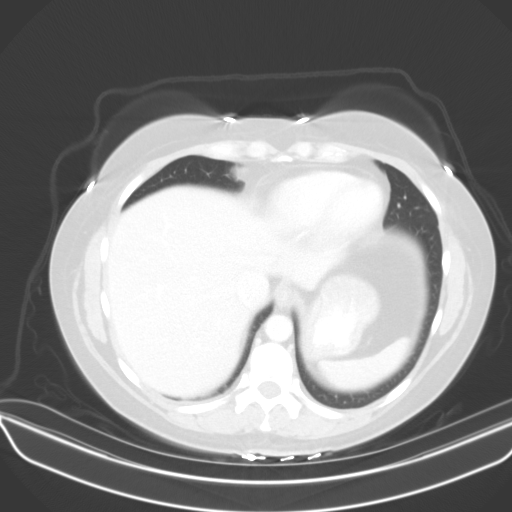
[im 92/99  soft-tissue]
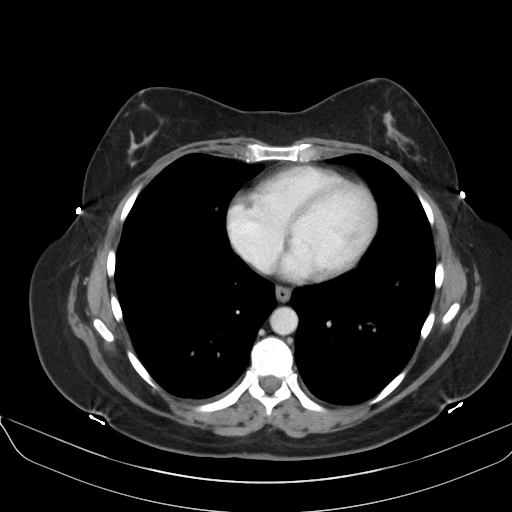
[im 92/99  lung]
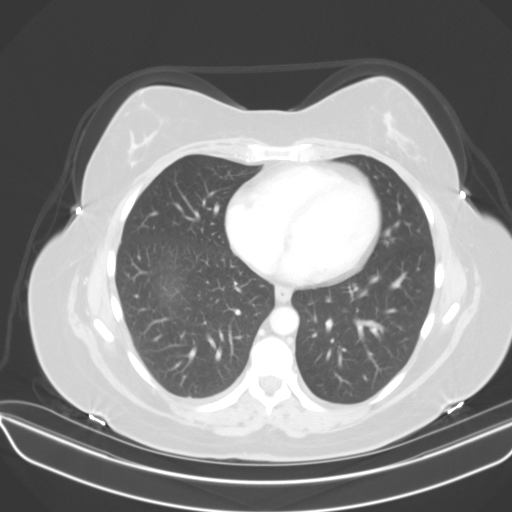

[13 of 32 positions shown; findings below may reference images not displayed]

FINDINGS: Lower Chest: No acute findings.

Hepatobiliary: No hepatic masses identified. Gallbladder is
unremarkable.

Pancreas:  No mass or inflammatory changes.

Spleen: Within normal limits in size and appearance.

Adrenals/Urinary Tract: A 1.2 cm left adrenal mass is seen which
shows contrast washout consistent with a benign adenoma. No renal
masses identified. No evidence of hydronephrosis.

Stomach/Bowel: No evidence of obstruction, inflammatory process or
abnormal fluid collections. Normal appendix visualized.

Vascular/Lymphatic: No pathologically enlarged lymph nodes. No
abdominal aortic aneurysm.

Reproductive: No mass or other significant abnormality. Prominent
cervical nabothian cysts incidentally noted.

Other:  None.

Musculoskeletal:  No suspicious bone lesions identified.
IMPRESSION: No acute findings.

Small benign left adrenal adenoma.

## 2018-07-11 MED ORDER — IOPAMIDOL (ISOVUE-300) INJECTION 61%
100.0000 mL | Freq: Once | INTRAVENOUS | Status: AC | PRN
  Administered 2018-07-11: 100 mL via INTRAVENOUS

## 2018-08-20 ENCOUNTER — Other Ambulatory Visit: Payer: Self-pay | Admitting: Obstetrics and Gynecology

## 2018-08-20 DIAGNOSIS — R5381 Other malaise: Secondary | ICD-10-CM

## 2018-09-30 ENCOUNTER — Ambulatory Visit: Payer: 59 | Admitting: Podiatry

## 2018-09-30 ENCOUNTER — Telehealth: Payer: Self-pay | Admitting: Podiatry

## 2018-09-30 ENCOUNTER — Other Ambulatory Visit: Payer: Self-pay | Admitting: Podiatry

## 2018-09-30 ENCOUNTER — Encounter: Payer: Self-pay | Admitting: Podiatry

## 2018-09-30 ENCOUNTER — Ambulatory Visit (INDEPENDENT_AMBULATORY_CARE_PROVIDER_SITE_OTHER): Payer: 59

## 2018-09-30 VITALS — BP 130/79 | HR 70

## 2018-09-30 DIAGNOSIS — M722 Plantar fascial fibromatosis: Secondary | ICD-10-CM

## 2018-09-30 DIAGNOSIS — M7731 Calcaneal spur, right foot: Secondary | ICD-10-CM

## 2018-09-30 NOTE — Telephone Encounter (Signed)
Sent fax to [employer]

## 2018-10-01 NOTE — Progress Notes (Signed)
   Subjective: 52 year old female presenting today as a new patient with a chief complaint of right foot pain that began two years ago. She states the pain is located laterally and at the posterior heel. She reports a fracture of the foot in 2017 and has had problems with it ever since. She reports having injections from Dr. Gershon Mussel in the last few weeks with no significant relief. There are no modifying factors noted. Patient is here for further evaluation and treatment.   Past Medical History:  Diagnosis Date  . Anemia   . Anxiety    claustrophobic  . Depression    no meds currently  . Headache   . Heart murmur    ? murmur as a child, unsure, never have had any problems  . Seasonal allergies   . SVD (spontaneous vaginal delivery)    x 1     Objective: Physical Exam General: The patient is alert and oriented x3 in no acute distress.  Dermatology: Skin is warm, dry and supple bilateral lower extremities. Negative for open lesions or macerations bilateral.   Vascular: Dorsalis Pedis and Posterior Tibial pulses palpable bilateral.  Capillary fill time is immediate to all digits.  Neurological: Epicritic and protective threshold intact bilateral.   Musculoskeletal: Tenderness to palpation to the plantar aspect of the right heel along the plantar fascia. Generalized right foot and ankle pain noted. All other joints range of motion within normal limits bilateral. Strength 5/5 in all groups bilateral.   Radiographic exam: Normal osseous mineralization. Joint spaces preserved. No fracture/dislocation/boney destruction. No other soft tissue abnormalities or radiopaque foreign bodies.   Assessment: 1. Plantar fasciitis right 2. Generalized foot and ankle pain right   Plan of Care:  1. Patient evaluated. Xrays reviewed.   2. Refill prescription for Duexis called into [facility].  3. Resume using custom orthotics from Dr. Lindley Magnus.  4. Return to clinic in 6 weeks. If not better, we  will discuss surgery.   Goes by [employer] Works at [employer] 24 hours weekly.     Edrick Kins, DPM [facility]  Dr. Edrick Kins, DPM    [address], [place] [address]                Office [phone]  Fax [phone]

## 2018-11-11 ENCOUNTER — Encounter: Payer: Self-pay | Admitting: Podiatry

## 2018-11-11 ENCOUNTER — Ambulatory Visit: Payer: 59 | Admitting: Podiatry

## 2018-11-11 DIAGNOSIS — M722 Plantar fascial fibromatosis: Secondary | ICD-10-CM | POA: Diagnosis not present

## 2018-11-18 NOTE — Progress Notes (Signed)
   Subjective: 53 year old female presenting today for follow up evaluation of right foot and ankle pain. She states she is doing better. She reports some continued intermittent pain in the arch and dorsum of the foot. There are no modifying factors noted. She has been taking Duexis for pain which helps alleviate the symptoms. Patient is here for further evaluation and treatment.   Past Medical History:  Diagnosis Date  . Anemia   . Anxiety    claustrophobic  . Depression    no meds currently  . Headache   . Heart murmur    ? murmur as a child, unsure, never have had any problems  . Seasonal allergies   . SVD (spontaneous vaginal delivery)    x 1     Objective: Physical Exam General: The patient is alert and oriented x3 in no acute distress.  Dermatology: Skin is warm, dry and supple bilateral lower extremities. Negative for open lesions or macerations bilateral.   Vascular: Dorsalis Pedis and Posterior Tibial pulses palpable bilateral.  Capillary fill time is immediate to all digits.  Neurological: Epicritic and protective threshold intact bilateral.   Musculoskeletal: Tenderness to palpation to the plantar aspect of the right heel along the plantar fascia. Generalized right foot and ankle pain noted. All other joints range of motion within normal limits bilateral. Strength 5/5 in all groups bilateral.   Assessment: 1. Plantar fasciitis right 2. Generalized foot and ankle pain right   Plan of Care:  1. Patient evaluated.  2. Continue conservative treatment at this time.  3. Continue taking Duexis as needed.  4. Recommended good shoe gear.  5. Continue using custom orthotics from Dr. Gershon Mussel.  6. Return to clinic as needed if patient would like surgery.    Goes by [employer] Works at [employer] 24 hours weekly.     Edrick Kins, DPM [facility]  Dr. Edrick Kins, DPM    [address],  [place] [address]                Office [phone]  Fax [phone]
